# Patient Record
Sex: Female | Born: 1940
Health system: Southern US, Community
[De-identification: ages and names within clinical notes are randomized; demographics above are authoritative.]

## PROBLEM LIST (undated history)

## (undated) DIAGNOSIS — E119 Type 2 diabetes mellitus without complications: Secondary | ICD-10-CM

## (undated) DIAGNOSIS — I10 Essential (primary) hypertension: Secondary | ICD-10-CM

---

## 1999-09-22 ENCOUNTER — Emergency Department (HOSPITAL_COMMUNITY): Admission: EM | Admit: 1999-09-22 | Discharge: 1999-09-22 | Payer: Self-pay

## 1999-10-03 ENCOUNTER — Emergency Department (HOSPITAL_COMMUNITY): Admission: EM | Admit: 1999-10-03 | Discharge: 1999-10-03 | Payer: Self-pay | Admitting: Emergency Medicine

## 2002-12-19 ENCOUNTER — Encounter: Admission: RE | Admit: 2002-12-19 | Discharge: 2003-03-19 | Payer: Self-pay | Admitting: Family Medicine

## 2003-04-03 ENCOUNTER — Encounter: Admission: RE | Admit: 2003-04-03 | Discharge: 2003-07-02 | Payer: Self-pay | Admitting: Family Medicine

## 2003-07-05 ENCOUNTER — Ambulatory Visit (HOSPITAL_COMMUNITY): Admission: RE | Admit: 2003-07-05 | Discharge: 2003-07-05 | Payer: Self-pay | Admitting: Gastroenterology

## 2007-08-05 ENCOUNTER — Emergency Department (HOSPITAL_COMMUNITY): Admission: EM | Admit: 2007-08-05 | Discharge: 2007-08-05 | Payer: Self-pay | Admitting: Emergency Medicine

## 2007-12-19 ENCOUNTER — Other Ambulatory Visit: Admission: RE | Admit: 2007-12-19 | Discharge: 2007-12-19 | Payer: Self-pay | Admitting: Family Medicine

## 2010-11-14 NOTE — Op Note (Signed)
NAME:  Kristi Jimenez, Kristi Jimenez                          ACCOUNT NO.:  [id number]   MEDICAL RECORD NO.:  [id number]                   PATIENT TYPE:  AMB   LOCATION:  ENDO                                 FACILITY:  [facility]   PHYSICIAN:  Graylin Shiver, M.D.                DATE OF BIRTH:  [birth year]   DATE OF PROCEDURE:  07/05/2003  DATE OF DISCHARGE:                                 OPERATIVE REPORT   PROCEDURE:  Flexible sigmoidoscopy.   INDICATION FOR PROCEDURE:  Weight loss, rule out colon lesion.   Informed consent was obtained after explanation of the risks of bleeding,  infection, and perforation.   PREMEDICATIONS:  1. Fentanyl 75 mcg IV.  2. Versed 8 mg IV.   DESCRIPTION OF PROCEDURE:  With the patient in the left lateral decubitus  position, a rectal exam was performed, and no masses were felt.  The Olympus  pediatric adjustable colonoscope was inserted into the rectum and advanced  into the sigmoid colon.  I intended to do a full colonoscopy but could not  advance the scope out of the sigmoid colon.  There was tortuosity and  fixation, and I could not advance it beyond the region of the mid sigmoid.  No abnormalities were seen.  I placed the patient on her back but still  could not advance, and I applied pressure to various points but still could  not advance the scope.  The scope was brought out, and no abnormalities were  seen.  She tolerated the procedure well.  A barium enema will be obtained.                                               Graylin Shiver, M.D.    Germain Osgood  D:  07/05/2003  T:  07/05/2003  Job:  [id number]   cc:   Chales Salmon. Abigail Miyamoto, M.D.  [address]  [place]  [place] [address]  Fax: [phone]

## 2015-08-26 DIAGNOSIS — E119 Type 2 diabetes mellitus without complications: Secondary | ICD-10-CM | POA: Diagnosis not present

## 2015-08-26 DIAGNOSIS — I10 Essential (primary) hypertension: Secondary | ICD-10-CM | POA: Diagnosis not present

## 2015-08-26 DIAGNOSIS — E782 Mixed hyperlipidemia: Secondary | ICD-10-CM | POA: Diagnosis not present

## 2015-08-26 DIAGNOSIS — F411 Generalized anxiety disorder: Secondary | ICD-10-CM | POA: Diagnosis not present

## 2015-09-19 DIAGNOSIS — E119 Type 2 diabetes mellitus without complications: Secondary | ICD-10-CM | POA: Diagnosis not present

## 2015-10-04 DIAGNOSIS — I10 Essential (primary) hypertension: Secondary | ICD-10-CM | POA: Diagnosis not present

## 2015-10-04 DIAGNOSIS — E782 Mixed hyperlipidemia: Secondary | ICD-10-CM | POA: Diagnosis not present

## 2015-10-04 DIAGNOSIS — E119 Type 2 diabetes mellitus without complications: Secondary | ICD-10-CM | POA: Diagnosis not present

## 2015-10-04 DIAGNOSIS — F411 Generalized anxiety disorder: Secondary | ICD-10-CM | POA: Diagnosis not present

## 2015-11-21 DIAGNOSIS — E119 Type 2 diabetes mellitus without complications: Secondary | ICD-10-CM | POA: Diagnosis not present

## 2016-02-18 DIAGNOSIS — E119 Type 2 diabetes mellitus without complications: Secondary | ICD-10-CM | POA: Diagnosis not present

## 2016-04-06 DIAGNOSIS — E119 Type 2 diabetes mellitus without complications: Secondary | ICD-10-CM | POA: Diagnosis not present

## 2016-04-06 DIAGNOSIS — I10 Essential (primary) hypertension: Secondary | ICD-10-CM | POA: Diagnosis not present

## 2016-04-06 DIAGNOSIS — E782 Mixed hyperlipidemia: Secondary | ICD-10-CM | POA: Diagnosis not present

## 2016-04-06 DIAGNOSIS — F411 Generalized anxiety disorder: Secondary | ICD-10-CM | POA: Diagnosis not present

## 2016-05-12 DIAGNOSIS — H401232 Low-tension glaucoma, bilateral, moderate stage: Secondary | ICD-10-CM | POA: Diagnosis not present

## 2016-05-14 DIAGNOSIS — E782 Mixed hyperlipidemia: Secondary | ICD-10-CM | POA: Diagnosis not present

## 2016-05-14 DIAGNOSIS — Z23 Encounter for immunization: Secondary | ICD-10-CM | POA: Diagnosis not present

## 2016-05-14 DIAGNOSIS — I10 Essential (primary) hypertension: Secondary | ICD-10-CM | POA: Diagnosis not present

## 2016-05-14 DIAGNOSIS — N959 Unspecified menopausal and perimenopausal disorder: Secondary | ICD-10-CM | POA: Diagnosis not present

## 2016-05-14 DIAGNOSIS — E119 Type 2 diabetes mellitus without complications: Secondary | ICD-10-CM | POA: Diagnosis not present

## 2016-05-14 DIAGNOSIS — F411 Generalized anxiety disorder: Secondary | ICD-10-CM | POA: Diagnosis not present

## 2016-05-30 DIAGNOSIS — E119 Type 2 diabetes mellitus without complications: Secondary | ICD-10-CM | POA: Diagnosis not present

## 2016-06-17 DIAGNOSIS — Z1231 Encounter for screening mammogram for malignant neoplasm of breast: Secondary | ICD-10-CM | POA: Diagnosis not present

## 2016-10-01 DIAGNOSIS — E119 Type 2 diabetes mellitus without complications: Secondary | ICD-10-CM | POA: Diagnosis not present

## 2016-12-25 DIAGNOSIS — Z1389 Encounter for screening for other disorder: Secondary | ICD-10-CM | POA: Diagnosis not present

## 2016-12-25 DIAGNOSIS — E782 Mixed hyperlipidemia: Secondary | ICD-10-CM | POA: Diagnosis not present

## 2016-12-25 DIAGNOSIS — I10 Essential (primary) hypertension: Secondary | ICD-10-CM | POA: Diagnosis not present

## 2016-12-25 DIAGNOSIS — E119 Type 2 diabetes mellitus without complications: Secondary | ICD-10-CM | POA: Diagnosis not present

## 2017-07-05 DIAGNOSIS — F411 Generalized anxiety disorder: Secondary | ICD-10-CM | POA: Diagnosis not present

## 2017-07-05 DIAGNOSIS — I10 Essential (primary) hypertension: Secondary | ICD-10-CM | POA: Diagnosis not present

## 2017-07-05 DIAGNOSIS — E782 Mixed hyperlipidemia: Secondary | ICD-10-CM | POA: Diagnosis not present

## 2017-07-05 DIAGNOSIS — E119 Type 2 diabetes mellitus without complications: Secondary | ICD-10-CM | POA: Diagnosis not present

## 2017-07-21 DIAGNOSIS — Z1231 Encounter for screening mammogram for malignant neoplasm of breast: Secondary | ICD-10-CM | POA: Diagnosis not present

## 2017-11-25 DIAGNOSIS — E119 Type 2 diabetes mellitus without complications: Secondary | ICD-10-CM | POA: Diagnosis not present

## 2018-01-06 DIAGNOSIS — E782 Mixed hyperlipidemia: Secondary | ICD-10-CM | POA: Diagnosis not present

## 2018-01-06 DIAGNOSIS — E119 Type 2 diabetes mellitus without complications: Secondary | ICD-10-CM | POA: Diagnosis not present

## 2018-01-24 DIAGNOSIS — E119 Type 2 diabetes mellitus without complications: Secondary | ICD-10-CM | POA: Diagnosis not present

## 2018-01-24 DIAGNOSIS — F411 Generalized anxiety disorder: Secondary | ICD-10-CM | POA: Diagnosis not present

## 2018-01-24 DIAGNOSIS — E782 Mixed hyperlipidemia: Secondary | ICD-10-CM | POA: Diagnosis not present

## 2018-01-24 DIAGNOSIS — I10 Essential (primary) hypertension: Secondary | ICD-10-CM | POA: Diagnosis not present

## 2018-03-01 DIAGNOSIS — E119 Type 2 diabetes mellitus without complications: Secondary | ICD-10-CM | POA: Diagnosis not present

## 2018-03-02 DIAGNOSIS — E119 Type 2 diabetes mellitus without complications: Secondary | ICD-10-CM | POA: Diagnosis not present

## 2018-06-03 DIAGNOSIS — E119 Type 2 diabetes mellitus without complications: Secondary | ICD-10-CM | POA: Diagnosis not present

## 2018-07-28 DIAGNOSIS — E119 Type 2 diabetes mellitus without complications: Secondary | ICD-10-CM | POA: Diagnosis not present

## 2018-07-28 DIAGNOSIS — E782 Mixed hyperlipidemia: Secondary | ICD-10-CM | POA: Diagnosis not present

## 2018-07-28 DIAGNOSIS — I10 Essential (primary) hypertension: Secondary | ICD-10-CM | POA: Diagnosis not present

## 2018-09-08 DIAGNOSIS — Z1231 Encounter for screening mammogram for malignant neoplasm of breast: Secondary | ICD-10-CM | POA: Diagnosis not present

## 2018-09-08 DIAGNOSIS — H401133 Primary open-angle glaucoma, bilateral, severe stage: Secondary | ICD-10-CM | POA: Diagnosis not present

## 2019-03-20 ENCOUNTER — Emergency Department (HOSPITAL_COMMUNITY)
Admission: EM | Admit: 2019-03-20 | Discharge: 2019-03-20 | Disposition: A | Discharge status: Home / Self Care | Payer: Medicare Other | Attending: Emergency Medicine | Admitting: Emergency Medicine

## 2019-03-20 ENCOUNTER — Encounter: Payer: Self-pay | Admitting: Emergency Medicine

## 2019-03-20 ENCOUNTER — Emergency Department (HOSPITAL_COMMUNITY): Payer: Medicare Other

## 2019-03-20 DIAGNOSIS — Y939 Activity, unspecified: Secondary | ICD-10-CM | POA: Diagnosis not present

## 2019-03-20 DIAGNOSIS — S0990XA Unspecified injury of head, initial encounter: Secondary | ICD-10-CM | POA: Insufficient documentation

## 2019-03-20 DIAGNOSIS — Y999 Unspecified external cause status: Secondary | ICD-10-CM | POA: Insufficient documentation

## 2019-03-20 DIAGNOSIS — Z23 Encounter for immunization: Secondary | ICD-10-CM | POA: Insufficient documentation

## 2019-03-20 DIAGNOSIS — E119 Type 2 diabetes mellitus without complications: Secondary | ICD-10-CM | POA: Insufficient documentation

## 2019-03-20 DIAGNOSIS — S199XXA Unspecified injury of neck, initial encounter: Secondary | ICD-10-CM | POA: Diagnosis not present

## 2019-03-20 DIAGNOSIS — S0993XA Unspecified injury of face, initial encounter: Secondary | ICD-10-CM | POA: Diagnosis not present

## 2019-03-20 DIAGNOSIS — I10 Essential (primary) hypertension: Secondary | ICD-10-CM | POA: Diagnosis not present

## 2019-03-20 DIAGNOSIS — Y92410 Unspecified street and highway as the place of occurrence of the external cause: Secondary | ICD-10-CM | POA: Diagnosis not present

## 2019-03-20 DIAGNOSIS — S2232XA Fracture of one rib, left side, initial encounter for closed fracture: Secondary | ICD-10-CM

## 2019-03-20 DIAGNOSIS — M542 Cervicalgia: Secondary | ICD-10-CM | POA: Diagnosis not present

## 2019-03-20 DIAGNOSIS — R0781 Pleurodynia: Secondary | ICD-10-CM | POA: Diagnosis not present

## 2019-03-20 DIAGNOSIS — S299XXA Unspecified injury of thorax, initial encounter: Secondary | ICD-10-CM | POA: Diagnosis not present

## 2019-03-20 DIAGNOSIS — R51 Headache: Secondary | ICD-10-CM | POA: Diagnosis not present

## 2019-03-20 HISTORY — DX: Essential (primary) hypertension: I10

## 2019-03-20 HISTORY — DX: Type 2 diabetes mellitus without complications: E11.9

## 2019-03-20 MED ORDER — TETANUS-DIPHTH-ACELL PERTUSSIS 5-2.5-18.5 LF-MCG/0.5 IM SUSP
0.5000 mL | Freq: Once | INTRAMUSCULAR | Status: AC
  Administered 2019-03-20: 0.5 mL via INTRAMUSCULAR
  Filled 2019-03-20: qty 0.5
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
## 2019-03-20 NOTE — Discharge Instructions (Addendum)
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
## 2019-04-05 DIAGNOSIS — S2232XD Fracture of one rib, left side, subsequent encounter for fracture with routine healing: Secondary | ICD-10-CM | POA: Diagnosis not present

## 2019-04-05 DIAGNOSIS — Z23 Encounter for immunization: Secondary | ICD-10-CM | POA: Diagnosis not present
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
## 2021-03-21 DIAGNOSIS — I10 Essential (primary) hypertension: Secondary | ICD-10-CM | POA: Diagnosis not present

## 2021-03-21 DIAGNOSIS — E782 Mixed hyperlipidemia: Secondary | ICD-10-CM | POA: Diagnosis not present

## 2021-03-21 DIAGNOSIS — E1169 Type 2 diabetes mellitus with other specified complication: Secondary | ICD-10-CM | POA: Diagnosis not present

## 2021-03-21 DIAGNOSIS — G3184 Mild cognitive impairment, so stated: Secondary | ICD-10-CM | POA: Diagnosis not present

## 2022-03-18 ENCOUNTER — Emergency Department (HOSPITAL_COMMUNITY)
Admission: EM | Admit: 2022-03-18 | Discharge: 2022-03-18 | Disposition: A | Discharge status: Home / Self Care | Payer: Medicare Other | Attending: Emergency Medicine | Admitting: Emergency Medicine

## 2022-03-18 ENCOUNTER — Emergency Department (HOSPITAL_COMMUNITY): Payer: Medicare Other

## 2022-03-18 ENCOUNTER — Encounter (HOSPITAL_COMMUNITY): Payer: Self-pay

## 2022-03-18 DIAGNOSIS — M5459 Other low back pain: Secondary | ICD-10-CM | POA: Diagnosis not present

## 2022-03-18 DIAGNOSIS — E860 Dehydration: Secondary | ICD-10-CM | POA: Diagnosis not present

## 2022-03-18 DIAGNOSIS — M545 Low back pain, unspecified: Secondary | ICD-10-CM | POA: Insufficient documentation

## 2022-03-18 DIAGNOSIS — I10 Essential (primary) hypertension: Secondary | ICD-10-CM | POA: Diagnosis not present

## 2022-03-18 DIAGNOSIS — E119 Type 2 diabetes mellitus without complications: Secondary | ICD-10-CM | POA: Insufficient documentation

## 2022-03-18 DIAGNOSIS — M546 Pain in thoracic spine: Secondary | ICD-10-CM | POA: Diagnosis not present

## 2022-03-18 LAB — CBC WITH DIFFERENTIAL/PLATELET
Abs Immature Granulocytes: 0.07 10*3/uL (ref 0.00–0.07)
Basophils Absolute: 0 10*3/uL (ref 0.0–0.1)
Basophils Relative: 0 %
Eosinophils Absolute: 0 10*3/uL (ref 0.0–0.5)
Eosinophils Relative: 0 %
HCT: 45.7 % (ref 36.0–46.0)
Hemoglobin: 15.5 g/dL — ABNORMAL HIGH (ref 12.0–15.0)
Immature Granulocytes: 1 %
Lymphocytes Relative: 6 %
Lymphs Abs: 0.6 10*3/uL — ABNORMAL LOW (ref 0.7–4.0)
MCH: 31.7 pg (ref 26.0–34.0)
MCHC: 33.9 g/dL (ref 30.0–36.0)
MCV: 93.5 fL (ref 80.0–100.0)
Monocytes Absolute: 0.4 10*3/uL (ref 0.1–1.0)
Monocytes Relative: 4 %
Neutro Abs: 9.2 10*3/uL — ABNORMAL HIGH (ref 1.7–7.7)
Neutrophils Relative %: 89 %
Platelets: 138 10*3/uL — ABNORMAL LOW (ref 150–400)
RBC: 4.89 MIL/uL (ref 3.87–5.11)
RDW: 12.1 % (ref 11.5–15.5)
WBC: 10.2 10*3/uL (ref 4.0–10.5)
nRBC: 0 % (ref 0.0–0.2)

## 2022-03-18 LAB — COMPREHENSIVE METABOLIC PANEL
ALT: 29 U/L (ref 0–44)
AST: 49 U/L — ABNORMAL HIGH (ref 15–41)
Albumin: 3.5 g/dL (ref 3.5–5.0)
Alkaline Phosphatase: 38 U/L (ref 38–126)
Anion gap: 15 (ref 5–15)
BUN: 37 mg/dL — ABNORMAL HIGH (ref 8–23)
CO2: 24 mmol/L (ref 22–32)
Calcium: 8.7 mg/dL — ABNORMAL LOW (ref 8.9–10.3)
Chloride: 98 mmol/L (ref 98–111)
Creatinine, Ser: 0.91 mg/dL (ref 0.44–1.00)
GFR, Estimated: >60 mL/min (ref >60)
Glucose, Bld: 102 mg/dL — ABNORMAL HIGH (ref 70–99)
Potassium: 3.9 mmol/L (ref 3.5–5.1)
Sodium: 137 mmol/L (ref 135–145)
Total Bilirubin: 1.2 mg/dL (ref 0.3–1.2)
Total Protein: 6.7 g/dL (ref 6.5–8.1)

## 2022-03-18 MED ORDER — SODIUM CHLORIDE 0.9 % IV BOLUS
1000.0000 mL | Freq: Once | INTRAVENOUS | Status: AC
  Administered 2022-03-18: 1000 mL via INTRAVENOUS

## 2022-03-18 NOTE — ED Notes (Signed)
Patient has no concerns after AVS has been reviewed and patient education provided. Patient discharged. 

## 2022-03-18 NOTE — ED Provider Notes (Signed)
[facility] DEPT Provider Note   CSN: [id number] Arrival date & time: 03/18/22  1740     History  Chief Complaint  Patient presents with   Back Pain    Kristi Jimenez is a 81 y.o. female.  Patient with history of hypertension and diabetes presents to the emergency department today for evaluation of dehydration and back pain.  She presents with a family member at bedside, who assists with history.  Patient developed back pain about 1 to 2 weeks ago.  She has had 2 falls after the back pain started.  Currently she states that her back pain has been improving.  Was seen at a walk-in clinic today and referred to the emergency department over concerns for dehydration.  Patient reports decreased oral intake.  No vomiting or diarrhea.  No recent URI symptoms, fever.  States that she was tested for UTI outpatient but it was negative.      Home Medications Prior to Admission medications   Not on File      Allergies    Patient has no known allergies.    Review of Systems   Review of Systems  Physical Exam Updated Vital Signs BP 130/84 (BP Location: Left Arm)   Pulse 87   Temp 97.7 F (36.5 C) (Axillary)   Resp 18   SpO2 97%   Physical Exam Vitals and nursing note reviewed.  Constitutional:      General: She is not in acute distress.    Appearance: She is well-developed.  HENT:     Head: Normocephalic and atraumatic.     Right Ear: External ear normal.     Left Ear: External ear normal.     Nose: Nose normal.  Eyes:     Conjunctiva/sclera: Conjunctivae normal.  Cardiovascular:     Rate and Rhythm: Normal rate and regular rhythm.     Heart sounds: No murmur heard. Pulmonary:     Effort: No respiratory distress.     Breath sounds: No wheezing, rhonchi or rales.  Abdominal:     Palpations: Abdomen is soft.     Tenderness: There is no abdominal tenderness. There is no guarding or rebound.  Musculoskeletal:     Cervical back: Normal range  of motion and neck supple. No spasms or tenderness. Normal range of motion.     Thoracic back: Tenderness present. No bony tenderness.     Lumbar back: Tenderness present. No bony tenderness.     Right lower leg: No edema.     Left lower leg: No edema.     Comments: Tenderness mid t-spine to upper lumbar spine.   Skin:    General: Skin is warm and dry.     Findings: No rash.  Neurological:     General: No focal deficit present.     Mental Status: She is alert. Mental status is at baseline.     Motor: No weakness.  Psychiatric:        Mood and Affect: Mood normal.    ED Results / Procedures / Treatments   Labs (all labs ordered are listed, but only abnormal results are displayed) Labs Reviewed  CBC WITH DIFFERENTIAL/PLATELET - Abnormal; Notable for the following components:      Result Value   Hemoglobin 15.5 (*)    Platelets 138 (*)    Neutro Abs 9.2 (*)    Lymphs Abs 0.6 (*)    All other components within normal limits  COMPREHENSIVE METABOLIC PANEL - Abnormal;  Notable for the following components:   Glucose, Bld 102 (*)    BUN 37 (*)    Calcium 8.7 (*)    AST 49 (*)    All other components within normal limits  URINALYSIS, ROUTINE W REFLEX MICROSCOPIC    EKG None  Radiology DG Thoracic Spine 2 View  Result Date: 03/18/2022 CLINICAL DATA:  Trauma, fall, back pain EXAM: THORACIC SPINE 2 VIEWS COMPARISON:  None Available. FINDINGS: There is slight decrease in height of anterior aspects of bodies of T7 and T8 vertebrae, possibly old compressions. There is no radiolucent fracture line. Degenerative changes are noted at T7-T8 level with disc space narrowing and endplate sclerosis. There is also narrowing of disc space at T8-T9 and T9-T10 levels. There are scattered arterial calcifications. IMPRESSION: There is mild decrease in height of bodies of T7 and T8 vertebrae along with adjacent disc degeneration. Findings most likely suggest old mild compression fractures. Less likely  possibility would be recent mild compression fractures. If there are continued symptoms or if there is any focal tenderness in this region, follow-up MRI may be considered. Electronically Signed   By: Ernie Avena M.D.   On: 03/18/2022 19:08   DG Lumbar Spine Complete  Result Date: 03/18/2022 CLINICAL DATA:  Larey Seat, mid low back pain EXAM: LUMBAR SPINE - COMPLETE 4+ VIEW COMPARISON:  None Available. FINDINGS: Frontal, bilateral oblique, lateral views of the lumbar spine are obtained. There are 5 non-rib-bearing lumbar type vertebral bodies identified with mild right convex scoliosis centered at L3. There are no acute displaced fractures. There is mild spondylosis at L1-2. Moderate facet hypertrophy at L4-5 and L5-S1. Sacroiliac joints are unremarkable. IMPRESSION: 1. No acute fracture. 2. L1-2 spondylosis, as well as L4-5 and L5-S1 facet hypertrophy. Electronically Signed   By: Sharlet Salina M.D.   On: 03/18/2022 19:06    Procedures Procedures    Medications Ordered in ED Medications  sodium chloride 0.9 % bolus 1,000 mL (has no administration in time range)    ED Course/ Medical Decision Making/ A&P    Patient seen and examined. History obtained directly from patient. Work-up including labs, imaging, EKG ordered in triage, if performed, were reviewed.    Labs/EKG: Independently reviewed and interpreted.  This included: CBC, CMP, UA.  Imaging: Independently visualized and interpreted.  This included: X-ray of the thoracic and lumbar spine.  Medications/Fluids: Ordered: IV fluid bolus   Most recent vital signs reviewed and are as follows: BP 130/84 (BP Location: Left Arm)   Pulse 87   Temp 97.7 F (36.5 C) (Axillary)   Resp 18   SpO2 97%   Initial impression: Back pain, decreased appetite, generalized weakness    Reassessment performed. Patient appears stable, comfortable.  Notified by RN that patient was requesting discharge.  Fluids finishing, CMP just resulted.  Patient  states that she needs to go because her ride is here.  Labs personally reviewed and interpreted including: CBC with normal white blood cell count, hemoglobin elevated at 15.5, platelets slightly low at 138 otherwise unremarkable; CMP with normal electrolytes, normal kidney function, BUN slightly elevated, with BUN/creatinine ratio suggestive of element of dehydration.   Imaging personally visualized and interpreted including: X-ray of the thoracic and lumbar spine 3 no acute fractures or other findings.  Reviewed pertinent lab work and imaging with patient at bedside. Questions answered.   Most current vital signs reviewed and are as follows: BP (!) 141/74   Pulse 89   Temp 97.7 F (36.5 C) (Axillary)  Resp 17   SpO2 98%   Plan: Discharge to home.   Prescriptions written for: None   Other home care instructions discussed: Tylenol, heat  ED return instructions discussed: Return with worsening symptoms or other concerns  Follow-up instructions discussed: Patient encouraged to follow-up with their PCP in 3 days.                            Medical Decision Making Amount and/or Complexity of Data Reviewed Labs: ordered. Radiology: ordered.   Patient with back pain and recent decreased oral intake.  Labs today are reassuring.  Possible element of dehydration given elevated BUN and elevated hemoglobin.  This was treated with a liter of IV fluids.  Patient has had recent back pain, however is improving.  X-rays are negative.  No lower extremity neurodeficits or other red flags.  We will continue to treat symptomatically.  Patient reports negative UA prior to arrival today.  Certainly no fevers or signs of sepsis.  We will have her follow-up with her primary care doctor for further evaluation of any residual symptoms.  The patient's vital signs, pertinent lab work and imaging were reviewed and interpreted as discussed in the ED course. Hospitalization was considered for further testing,  treatments, or serial exams/observation. However as patient is well-appearing, has a stable exam, and reassuring studies today, I do not feel that they warrant admission at this time. This plan was discussed with the patient who verbalizes agreement and comfort with this plan and seems reliable and able to return to the Emergency Department with worsening or changing symptoms.          Final Clinical Impression(s) / ED Diagnoses Final diagnoses:  Acute midline low back pain without sciatica    Rx / DC Orders ED Discharge Orders     None         Renne Crigler, Cordelia Poche 03/18/22 2157    Charlynne Pander, MD 03/18/22 2356

## 2022-03-18 NOTE — ED Triage Notes (Signed)
Pt arrived via POV, c/o back pain. Worsening pain, states pain caused her to fall. Has had less appetite and mobility.

## 2022-03-18 NOTE — Discharge Instructions (Signed)
Please read and follow all provided instructions.  Your diagnoses today include:  1. Acute midline low back pain without sciatica     Tests performed today include: Blood counts and electrolytes: Showed slightly elevated red blood cell count, otherwise unremarkable X-ray of the middle and lower back: Does not show any signs of new fracture Vital signs. See below for your results today.   Medications prescribed:  Use Tylenol as directed on the packaging for pain control.  You may also apply heat to the area.  Take any prescribed medications only as directed.  Home care instructions:  Follow any educational materials contained in this packet.  BE VERY CAREFUL not to take multiple medicines containing Tylenol (also called acetaminophen). Doing so can lead to an overdose which can damage your liver and cause liver failure and possibly death.   Follow-up instructions: Please follow-up with your primary care provider in the next 3 days for further evaluation of your symptoms.   Return instructions:  Please return to the Emergency Department if you experience worsening symptoms.  Please return if you have any other emergent concerns.  Additional Information:  Your vital signs today were: BP (!) 141/74   Pulse 89   Temp 97.7 F (36.5 C) (Axillary)   Resp 17   SpO2 98%  If your blood pressure (BP) was elevated above 135/85 this visit, please have this repeated by your doctor within one month. --------------

## 2022-03-20 DIAGNOSIS — E860 Dehydration: Secondary | ICD-10-CM | POA: Diagnosis not present

## 2022-03-20 DIAGNOSIS — I10 Essential (primary) hypertension: Secondary | ICD-10-CM | POA: Diagnosis not present

## 2022-03-20 DIAGNOSIS — M545 Low back pain, unspecified: Secondary | ICD-10-CM | POA: Diagnosis not present

## 2022-03-20 DIAGNOSIS — E782 Mixed hyperlipidemia: Secondary | ICD-10-CM | POA: Diagnosis not present

## 2022-03-20 DIAGNOSIS — E1169 Type 2 diabetes mellitus with other specified complication: Secondary | ICD-10-CM | POA: Diagnosis not present

## 2022-03-30 ENCOUNTER — Telehealth: Payer: Self-pay | Admitting: *Deleted
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
## 2022-04-10 DIAGNOSIS — E039 Hypothyroidism, unspecified: Secondary | ICD-10-CM | POA: Diagnosis not present
